# Patient Record
Sex: Male | Born: 1940 | Race: White | Hispanic: No | Marital: Single | Smoking: Current every day smoker
Health system: Southern US, Community
[De-identification: ages and names within clinical notes are randomized; demographics above are authoritative.]

## PROBLEM LIST (undated history)

## (undated) DIAGNOSIS — F329 Major depressive disorder, single episode, unspecified: Secondary | ICD-10-CM

## (undated) DIAGNOSIS — F32A Depression, unspecified: Secondary | ICD-10-CM

## (undated) DIAGNOSIS — N429 Disorder of prostate, unspecified: Secondary | ICD-10-CM

## (undated) DIAGNOSIS — I10 Essential (primary) hypertension: Secondary | ICD-10-CM

## (undated) DIAGNOSIS — F909 Attention-deficit hyperactivity disorder, unspecified type: Secondary | ICD-10-CM

---

## 2017-05-27 ENCOUNTER — Emergency Department (HOSPITAL_COMMUNITY)
Admission: EM | Admit: 2017-05-27 | Discharge: 2017-05-27 | Disposition: A | Discharge status: Home / Self Care | Payer: Self-pay | Attending: Emergency Medicine | Admitting: Emergency Medicine

## 2017-05-27 ENCOUNTER — Emergency Department (HOSPITAL_COMMUNITY): Payer: Self-pay

## 2017-05-27 ENCOUNTER — Other Ambulatory Visit: Payer: Self-pay

## 2017-05-27 ENCOUNTER — Encounter (HOSPITAL_COMMUNITY): Payer: Self-pay | Admitting: *Deleted

## 2017-05-27 DIAGNOSIS — I10 Essential (primary) hypertension: Secondary | ICD-10-CM | POA: Insufficient documentation

## 2017-05-27 DIAGNOSIS — I160 Hypertensive urgency: Secondary | ICD-10-CM | POA: Insufficient documentation

## 2017-05-27 DIAGNOSIS — F17200 Nicotine dependence, unspecified, uncomplicated: Secondary | ICD-10-CM | POA: Insufficient documentation

## 2017-05-27 DIAGNOSIS — Z79899 Other long term (current) drug therapy: Secondary | ICD-10-CM | POA: Insufficient documentation

## 2017-05-27 HISTORY — DX: Disorder of prostate, unspecified: N42.9

## 2017-05-27 HISTORY — DX: Major depressive disorder, single episode, unspecified: F32.9

## 2017-05-27 HISTORY — DX: Depression, unspecified: F32.A

## 2017-05-27 HISTORY — DX: Attention-deficit hyperactivity disorder, unspecified type: F90.9

## 2017-05-27 HISTORY — DX: Essential (primary) hypertension: I10

## 2017-05-27 LAB — CBC
HEMATOCRIT: 37.4 % — AB (ref 39.0–52.0)
HEMOGLOBIN: 12.7 g/dL — AB (ref 13.0–17.0)
MCH: 32.6 pg (ref 26.0–34.0)
MCHC: 34 g/dL (ref 30.0–36.0)
MCV: 96.1 fL (ref 78.0–100.0)
Platelets: 285 10*3/uL (ref 150–400)
RBC: 3.89 MIL/uL — AB (ref 4.22–5.81)
RDW: 12.5 % (ref 11.5–15.5)
WBC: 4.7 10*3/uL (ref 4.0–10.5)

## 2017-05-27 LAB — PROTIME-INR
INR: 0.99
Prothrombin Time: 13 seconds (ref 11.4–15.2)

## 2017-05-27 LAB — DIFFERENTIAL
Basophils Absolute: 0.1 10*3/uL (ref 0.0–0.1)
Basophils Relative: 2 %
EOS PCT: 5 %
Eosinophils Absolute: 0.2 10*3/uL (ref 0.0–0.7)
LYMPHS ABS: 1.5 10*3/uL (ref 0.7–4.0)
LYMPHS PCT: 32 %
MONO ABS: 0.3 10*3/uL (ref 0.1–1.0)
MONOS PCT: 6 %
Neutro Abs: 2.6 10*3/uL (ref 1.7–7.7)
Neutrophils Relative %: 55 %

## 2017-05-27 LAB — COMPREHENSIVE METABOLIC PANEL
ALBUMIN: 4 g/dL (ref 3.5–5.0)
ALK PHOS: 57 U/L (ref 38–126)
ALT: 13 U/L — ABNORMAL LOW (ref 17–63)
ANION GAP: 9 (ref 5–15)
AST: 18 U/L (ref 15–41)
BILIRUBIN TOTAL: 0.9 mg/dL (ref 0.3–1.2)
BUN: 13 mg/dL (ref 6–20)
CALCIUM: 9.5 mg/dL (ref 8.9–10.3)
CO2: 26 mmol/L (ref 22–32)
Chloride: 100 mmol/L — ABNORMAL LOW (ref 101–111)
Creatinine, Ser: 0.87 mg/dL (ref 0.61–1.24)
GLUCOSE: 110 mg/dL — AB (ref 65–99)
Potassium: 4.7 mmol/L (ref 3.5–5.1)
Sodium: 135 mmol/L (ref 135–145)
TOTAL PROTEIN: 6.6 g/dL (ref 6.5–8.1)

## 2017-05-27 LAB — I-STAT CHEM 8, ED
BUN: 14 mg/dL (ref 6–20)
CREATININE: 0.8 mg/dL (ref 0.61–1.24)
Calcium, Ion: 1.19 mmol/L (ref 1.15–1.40)
Chloride: 98 mmol/L — ABNORMAL LOW (ref 101–111)
Glucose, Bld: 105 mg/dL — ABNORMAL HIGH (ref 65–99)
HEMATOCRIT: 37 % — AB (ref 39.0–52.0)
Hemoglobin: 12.6 g/dL — ABNORMAL LOW (ref 13.0–17.0)
Potassium: 4.4 mmol/L (ref 3.5–5.1)
Sodium: 135 mmol/L (ref 135–145)
TCO2: 26 mmol/L (ref 22–32)

## 2017-05-27 LAB — CBG MONITORING, ED: GLUCOSE-CAPILLARY: 94 mg/dL (ref 65–99)

## 2017-05-27 LAB — APTT: aPTT: 29 seconds (ref 24–36)

## 2017-05-27 LAB — I-STAT TROPONIN, ED: Troponin i, poc: 0 ng/mL (ref 0.00–0.08)

## 2017-05-27 MED ORDER — HYDRALAZINE HCL 20 MG/ML IJ SOLN
10.0000 mg | INTRAMUSCULAR | Status: AC
  Administered 2017-05-27: 10 mg via INTRAVENOUS
  Filled 2017-05-27: qty 1

## 2017-05-27 MED ORDER — HYDRALAZINE HCL 10 MG PO TABS: 5.0000 mg | ORAL_TABLET | Freq: Two times a day (BID) | ORAL | 0 refills | Status: DC

## 2017-05-27 NOTE — ED Triage Notes (Signed)
Pt arrived via ems with complaint of high bp, headache intermittent for 2-3 weeks and dizziness intermittently for about the same time.  No headache or dizziness at this time.  Reports ringing in his ears.  Pt is alert.  CBG 94 per ems with BP 190/100.  Pt has no active neuro deficits at this time

## 2017-05-27 NOTE — Discharge Instructions (Signed)
As discussed, today's evaluation has been generally reassuring.  On we have made one adjustment to your medication regimen for blood pressure control. Be sure to see your physician as scheduled on Monday, and to discuss today's adjustments.  Return here for concerning changes in your condition.

## 2017-05-27 NOTE — ED Notes (Signed)
The pt has had an intermittent headache  For 3-4 weeks  No headache now he takes ibu and that usually helps  Dizziness intermittent.  None now dx with vertigo

## 2017-05-27 NOTE — ED Notes (Signed)
The pt has been homeless since last July he just recently found a place to live

## 2017-05-27 NOTE — ED Notes (Signed)
[facility] d/t fall risk

## 2017-05-27 NOTE — ED Provider Notes (Signed)
[facility] Provider Note   CSN: [id number] Arrival date & time: 05/27/17  1022     History   Chief Complaint Chief Complaint  Patient presents with  . Headache  . Dizziness  . Hypertension    HPI Herbert Lewis is a 77 y.o. male.  HPI  Patient presents with concern of hypertension. Patient has a history of hypertension dating back years, states that he takes all medication as directed, 1 provided to him at his halfway house. He notes that for long time he has had episodes of headache, dizziness, and these have been unchanged, and today are actually better than those he experienced yesterday. However, given the patient's history of hypertension, today, after the nurse found him to be hypertensive he was sent here for evaluation per Currently denies any ongoing headache, dizziness, weakness in his extremities. He denies recent medication change, diet change, activity change.   Past Medical History:  Diagnosis Date  . ADHD   . Depression   . Hypertension   . Prostate disease     There are no active problems to display for this patient.   History reviewed. No pertinent surgical history.     Home Medications    Prior to Admission medications   Medication Sig Start Date End Date Taking? Authorizing Provider  albuterol (PROVENTIL HFA;VENTOLIN HFA) 108 (90 Base) MCG/ACT inhaler Inhale 1-2 puffs into the lungs every 6 (six) hours as needed for wheezing or shortness of breath.   Yes [provider]  FLUoxetine (PROZAC) 20 MG capsule Take 40 mg by mouth daily. 09/01/16  Yes [provider]  ibuprofen (ADVIL,MOTRIN) 200 MG tablet Take 800 mg by mouth every 6 (six) hours as needed for headache or moderate pain.   Yes [provider]  lisinopril (PRINIVIL,ZESTRIL) 10 MG tablet Take 1 tablet by mouth daily.   Yes [provider]  Meclizine HCl 25 MG CHEW Chew 1 tablet by mouth daily as needed for dizziness.    Yes [provider]  methylphenidate (RITALIN) 20 MG tablet Take 20 mg by mouth daily. 08/30/16  Yes [provider]  QUEtiapine (SEROQUEL) 100 MG tablet Take 150 mg by mouth at bedtime. 08/30/16  Yes [provider]  tamsulosin (FLOMAX) 0.4 MG CAPS capsule Take 0.4 mg by mouth daily after supper.   Yes [provider]    Family History No family history on file.  Social History Social History   Tobacco Use  . Smoking status: Current Every Day Smoker  . Smokeless tobacco: Current User  Substance Use Topics  . Alcohol use: No    Frequency: Never  . Drug use: No     Allergies   Patient has no known allergies.   Review of Systems Review of Systems  Constitutional:       Per HPI, otherwise negative  HENT:       Per HPI, otherwise negative  Respiratory:       Per HPI, otherwise negative  Cardiovascular:       Per HPI, otherwise negative  Gastrointestinal: Negative for vomiting.  Endocrine:       Negative aside from HPI  Genitourinary:       Neg aside from HPI   Musculoskeletal:       Per HPI, otherwise negative  Skin: Negative.   Neurological: Positive for dizziness and headaches. Negative for syncope.     Physical Exam Updated Vital Signs BP (!) 176/95   Pulse 67  Temp 98.6 F (37 C) (Oral)   Resp 17   SpO2 98%   Physical Exam  Constitutional: He is oriented to person, place, and time. He appears well-developed. No distress.  HENT:  Head: Normocephalic and atraumatic.  Mouth/Throat:    Eyes: Conjunctivae and EOM are normal.  Cardiovascular: Normal rate and regular rhythm.  Pulmonary/Chest: Effort normal. No stridor. No respiratory distress.  Abdominal: He exhibits no distension.  Musculoskeletal: He exhibits no edema.  Neurological: He is alert and oriented to person, place, and time. He displays no tremor. No cranial nerve deficit. He exhibits normal muscle tone. Coordination normal.  Skin: Skin is warm and dry.    Psychiatric: He has a normal mood and affect.  Nursing note and vitals reviewed.    ED Treatments / Results  Labs (all labs ordered are listed, but only abnormal results are displayed) Labs Reviewed  CBC - Abnormal; Notable for the following components:      Result Value   RBC 3.89 (*)    Hemoglobin 12.7 (*)    HCT 37.4 (*)    All other components within normal limits  COMPREHENSIVE METABOLIC PANEL - Abnormal; Notable for the following components:   Chloride 100 (*)    Glucose, Bld 110 (*)    ALT 13 (*)    All other components within normal limits  I-STAT CHEM 8, ED - Abnormal; Notable for the following components:   Chloride 98 (*)    Glucose, Bld 105 (*)    Hemoglobin 12.6 (*)    HCT 37.0 (*)    All other components within normal limits  PROTIME-INR  APTT  DIFFERENTIAL  I-STAT TROPONIN, ED  CBG MONITORING, ED    EKG  EKG Interpretation  Date/Time:  Friday May 27 2017 10:39:00 EDT Ventricular Rate:  73 PR Interval:  172 QRS Duration: 102 QT Interval:  396 QTC Calculation: 436 R Axis:   -26 Text Interpretation:  Normal sinus rhythm Minimal voltage criteria for LVH, may be normal variant Nonspecific T wave abnormality Abnormal ekg Confirmed by Gerhard Munch [phone]) on 05/27/2017 6:39:55 PM       Radiology Ct Head Wo Contrast  Result Date: 05/27/2017 CLINICAL DATA:  Dizziness and hypertension. EXAM: CT HEAD WITHOUT CONTRAST TECHNIQUE: Contiguous axial images were obtained from the base of the skull through the vertex without intravenous contrast. COMPARISON:  None. FINDINGS: Brain: There is mild to moderate diffuse atrophy. There is no intracranial mass, hemorrhage, extra-axial fluid collection, or midline shift. There is slight small vessel disease in the centra semiovale bilaterally. Elsewhere gray-white compartments appear normal. No evident acute infarct. Vascular: No hyperdense vessels evident. There is calcification in each carotid siphon and distal  vertebral artery. Skull: Bony calvarium appears intact. Sinuses/Orbits: There is opacification in multiple ethmoid air cells bilaterally. There is also mucosal thickening in the inferior right frontal sinus. There is opacification with mucosal thickening in the anterior sphenoid sinuses bilaterally. Visualized orbits appear symmetric bilaterally. Other: Mastoid air cells are clear. IMPRESSION: Atrophy with mild periventricular small vessel disease. No acute infarct evident. No mass or hemorrhage. There are foci of arterial vascular calcification. There are foci of paranasal sinus disease. Electronically Signed   By: Bretta Bang III M.D.   On: 05/27/2017 11:12    Procedures Procedures (including critical care time)  Medications Ordered in ED Medications  hydrALAZINE (APRESOLINE) injection 10 mg (10 mg Intravenous Given 05/27/17 2020)     Initial Impression / Assessment and Plan / ED Course  I have reviewed the triage vital signs and the nursing notes.  Pertinent labs & imaging results that were available during my care of the patient were reviewed by me and considered in my medical decision making (see chart for details).     9:57 PM Patient in no distress, awake, alert, smiling, states that he feels better. Blood pressure has diminished. We discussed the importance of following up with primary care. Now with no ongoing symptoms, no evidence for new endorgan effects, no distress, and with his improvement, the patient is appropriate for medication adjustments with his physician. He notes that he has an appointment scheduled in 48 hours.  On he was encouraged to make sure to go to that appointment.   Final Clinical Impressions(s) / ED Diagnoses  Hypertensive urgency   Gerhard Munch, MD 05/27/17 2158

## 2017-06-07 ENCOUNTER — Encounter (HOSPITAL_COMMUNITY): Payer: Self-pay | Admitting: Emergency Medicine

## 2017-06-07 ENCOUNTER — Emergency Department (HOSPITAL_COMMUNITY)
Admission: EM | Admit: 2017-06-07 | Discharge: 2017-06-07 | Disposition: A | Discharge status: Home / Self Care | Payer: Self-pay | Attending: Emergency Medicine | Admitting: Emergency Medicine

## 2017-06-07 DIAGNOSIS — Z79899 Other long term (current) drug therapy: Secondary | ICD-10-CM | POA: Insufficient documentation

## 2017-06-07 DIAGNOSIS — F17210 Nicotine dependence, cigarettes, uncomplicated: Secondary | ICD-10-CM | POA: Insufficient documentation

## 2017-06-07 DIAGNOSIS — R42 Dizziness and giddiness: Secondary | ICD-10-CM | POA: Insufficient documentation

## 2017-06-07 DIAGNOSIS — I10 Essential (primary) hypertension: Secondary | ICD-10-CM | POA: Insufficient documentation

## 2017-06-07 LAB — URINALYSIS, ROUTINE W REFLEX MICROSCOPIC
BILIRUBIN URINE: NEGATIVE
GLUCOSE, UA: NEGATIVE mg/dL
HGB URINE DIPSTICK: NEGATIVE
Ketones, ur: NEGATIVE mg/dL
Leukocytes, UA: NEGATIVE
Nitrite: NEGATIVE
PROTEIN: NEGATIVE mg/dL
Specific Gravity, Urine: 1.005 (ref 1.005–1.030)
pH: 7 (ref 5.0–8.0)

## 2017-06-07 LAB — CBC
HCT: 36.3 % — ABNORMAL LOW (ref 39.0–52.0)
Hemoglobin: 12.6 g/dL — ABNORMAL LOW (ref 13.0–17.0)
MCH: 33.1 pg (ref 26.0–34.0)
MCHC: 34.7 g/dL (ref 30.0–36.0)
MCV: 95.3 fL (ref 78.0–100.0)
PLATELETS: 326 10*3/uL (ref 150–400)
RBC: 3.81 MIL/uL — AB (ref 4.22–5.81)
RDW: 12.3 % (ref 11.5–15.5)
WBC: 5.7 10*3/uL (ref 4.0–10.5)

## 2017-06-07 LAB — BASIC METABOLIC PANEL
Anion gap: 11 (ref 5–15)
BUN: 11 mg/dL (ref 6–20)
CALCIUM: 9.1 mg/dL (ref 8.9–10.3)
CHLORIDE: 97 mmol/L — AB (ref 101–111)
CO2: 26 mmol/L (ref 22–32)
CREATININE: 0.94 mg/dL (ref 0.61–1.24)
GFR calc Af Amer: >60 mL/min (ref >60)
GFR calc non Af Amer: >60 mL/min (ref >60)
GLUCOSE: 106 mg/dL — AB (ref 65–99)
Potassium: 4.1 mmol/L (ref 3.5–5.1)
Sodium: 134 mmol/L — ABNORMAL LOW (ref 135–145)

## 2017-06-07 LAB — I-STAT TROPONIN, ED: Troponin i, poc: 0 ng/mL (ref 0.00–0.08)

## 2017-06-07 MED ORDER — HYDRALAZINE HCL 20 MG/ML IJ SOLN
10.0000 mg | Freq: Once | INTRAMUSCULAR | Status: AC
  Administered 2017-06-07: 10 mg via INTRAVENOUS
  Filled 2017-06-07: qty 1

## 2017-06-07 MED ORDER — AMLODIPINE BESYLATE 10 MG PO TABS: 10.0000 mg | ORAL_TABLET | Freq: Every day | ORAL | 0 refills | Status: AC

## 2017-06-07 MED ORDER — LISINOPRIL 10 MG PO TABS
10.0000 mg | ORAL_TABLET | Freq: Once | ORAL | Status: AC
  Administered 2017-06-07: 10 mg via ORAL
  Filled 2017-06-07: qty 1

## 2017-06-07 NOTE — Discharge Instructions (Signed)
Start amlodipine. Take both amlodipine and lisinopril daily, as prescribed. Throw away your prescription for hydralazine, and do not take this medication.   Avoid tobacco, caffeine, energy drinks. These increase blood pressure.   Sit down and wait 5 minute before checking your blood pressure, make sure your arm is fully extended and at heart level. Goal blood pressure is around 150/90s, some variation is ok.    Return for severe headache, vision changes, vomiting, slurred speech, facial drooping, weakness or heaviness to extremities, chest pain, tearing back pain.

## 2017-06-07 NOTE — ED Notes (Signed)
Meal and drink given to pt - per Debarah Crapelaudia EDPA

## 2017-06-07 NOTE — ED Triage Notes (Signed)
Pt arrives via EMS from home with complaints of HTN, HA and dizziness for 2-3 weeks. Pt was seen here 2 weeks ago and given a prescription, however, he reports his PCP increased his lisinopril instead of changing medications. 20G LAC

## 2017-06-07 NOTE — ED Notes (Signed)
Pt reports intermittent lightheadedness x 2-3 weeks with HTN.  Have been seen in the ED for same, [facility] and increased his lisinopril without relief.  Pt denies any lightheadedness at present but started to have a h/a while waiting to be seen in the ED.  Pt is A&Ox 4.  Negative for cp or SOB.  Pt reports hx of vertigo.

## 2017-06-07 NOTE — ED Provider Notes (Signed)
[facility] Provider Note   CSN: [id number] Arrival date & time: 06/07/17  1230     History   Chief Complaint Chief Complaint  Patient presents with  . Hypertension  . Dizziness    HPI Herbert Lewis is a 77 y.o. male w/ h/o HTN, depression, here for concern of elevated BP reading this morning up to 200/109 and 109/114. Reports BP has been persistently elevated since end of 2018, [facility]ril 10 mg BID to TID however not helping. Last took lisinopril this AM. Reports ongoing headaches and light-headedness since end of 2018 that he attributes to elevated BP. Light-headedness described as "feels like I need to lay down", no CP, SOB, palpitations with it. Has h/o vertigo that usually occurs when he lays down. Had slight headache while in waiting room but not currently. Asymptomatic now. Has f/u with PCP at [facility] in 2 days. Seen in ED for same 10 days ago.   HPI  Past Medical History:  Diagnosis Date  . ADHD   . Depression   . Hypertension   . Prostate disease     There are no active problems to display for this patient.   History reviewed. No pertinent surgical history.      Home Medications    Prior to Admission medications   Medication Sig Start Date End Date Taking? Authorizing Provider  albuterol (PROVENTIL HFA;VENTOLIN HFA) 108 (90 Base) MCG/ACT inhaler Inhale 1-2 puffs into the lungs every 6 (six) hours as needed for wheezing or shortness of breath.   Yes [provider]  FLUoxetine (PROZAC) 40 MG capsule Take 80 mg by mouth daily.  09/01/16  Yes [provider]  ibuprofen (ADVIL,MOTRIN) 200 MG tablet Take 800 mg by mouth every 6 (six) hours as needed for headache or moderate pain.   Yes [provider]  lisinopril (PRINIVIL,ZESTRIL) 10 MG tablet Take 10 mg by mouth 3 (three) times daily.    Yes [provider]  Meclizine HCl 25 MG CHEW Chew 1 tablet by mouth daily as needed  for dizziness.   Yes [provider]  methylphenidate (RITALIN) 20 MG tablet Take 20 mg by mouth daily. 08/30/16  Yes [provider]  oxybutynin (DITROPAN) 5 MG tablet Take 5 mg by mouth 3 (three) times daily.   Yes [provider]  QUEtiapine (SEROQUEL) 100 MG tablet Take 150 mg by mouth at bedtime. 08/30/16  Yes [provider]  tamsulosin (FLOMAX) 0.4 MG CAPS capsule Take 0.4 mg by mouth daily after supper.   Yes [provider]  amLODipine (NORVASC) 10 MG tablet Take 1 tablet (10 mg total) by mouth daily for 15 days. 06/07/17 06/22/17  Liberty HandyGibbons, Megha Agnes J, PA-C    Family History No family history on file.  Social History Social History   Tobacco Use  . Smoking status: Current Every Day Smoker  . Smokeless tobacco: Current User  Substance Use Topics  . Alcohol use: No    Frequency: Never  . Drug use: No     Allergies   Codeine   Review of Systems Review of Systems  Neurological: Positive for light-headedness (not currently) and headaches (not currently; several months).     Physical Exam Updated Vital Signs BP (!) 147/85 (BP Location: Left Arm)   Pulse 67   Resp 17   SpO2 97%   Physical Exam  Constitutional: He appears well-developed and well-nourished.  NAD. Non toxic.   HENT:  Head:  Normocephalic and atraumatic.  Nose: Nose normal.  Moist mucous membranes. Tonsils and oropharynx normal  Eyes: Conjunctivae, EOM and lids are normal.  Neck: Trachea normal and normal range of motion.  Neck is supple Trachea midline No cervical adenopathy  Cardiovascular: Normal rate, regular rhythm, S1 normal, S2 normal and normal heart sounds.  Pulses:      Carotid pulses are 2+ on the right side, and 2+ on the left side.      Radial pulses are 2+ on the right side, and 2+ on the left side.       Dorsalis pedis pulses are 2+ on the right side, and 2+ on the left side.  RRR. No orthopnea. No LE edema or calf tenderness.     Pulmonary/Chest: Effort normal and breath sounds normal. No respiratory distress. He has no decreased breath sounds. He has no rhonchi.  No reproducible chest wall tenderness. CP not reproducible with AROM of upper extremities. No rales or wheezing.  Abdominal: Soft. Bowel sounds are normal. There is no tenderness.  No epigastric tenderness. No distention.   Neurological: He is alert. GCS eye subscore is 4. GCS verbal subscore is 5. GCS motor subscore is 6.  Skin: Skin is warm and dry. Capillary refill takes less than 2 seconds.  No rash to chest wall  Psychiatric: He has a normal mood and affect. His speech is normal and behavior is normal. Judgment and thought content normal. Cognition and memory are normal.     ED Treatments / Results  Labs (all labs ordered are listed, but only abnormal results are displayed) Labs Reviewed  BASIC METABOLIC PANEL - Abnormal; Notable for the following components:      Result Value   Sodium 134 (*)    Chloride 97 (*)    Glucose, Bld 106 (*)    All other components within normal limits  CBC - Abnormal; Notable for the following components:   RBC 3.81 (*)    Hemoglobin 12.6 (*)    HCT 36.3 (*)    All other components within normal limits  URINALYSIS, ROUTINE W REFLEX MICROSCOPIC - Abnormal; Notable for the following components:   Color, Urine STRAW (*)    All other components within normal limits  I-STAT TROPONIN, ED  CBG MONITORING, ED    EKG EKG Interpretation  Date/Time:  Tuesday June 07 2017 12:42:27 EDT Ventricular Rate:  65 PR Interval:  158 QRS Duration: 94 QT Interval:  408 QTC Calculation: 424 R Axis:   -15 Text Interpretation:  Normal sinus rhythm Left ventricular hypertrophy with repolarization abnormality Abnormal ECG new T wave inversions inferiorly and V5-V6 Confirmed by Frederick Peers [phone]) on 06/07/2017 7:53:23 PM   Radiology No results found.  Procedures Procedures (including critical care time)  Medications  Ordered in ED Medications  hydrALAZINE (APRESOLINE) injection 10 mg (10 mg Intravenous Given 06/07/17 2115)  lisinopril (PRINIVIL,ZESTRIL) tablet 10 mg (10 mg Oral Given 06/07/17 2114)     Initial Impression / Assessment and Plan / ED Course  I have reviewed the triage vital signs and the nursing notes.  Pertinent labs & imaging results that were available during my care of the patient were reviewed by me and considered in my medical decision making (see chart for details).    77 year old male with hypertension here for concern of elevated blood pressure reading and intermittent, mild headache and lightheadedness, chronic. Seen in the ED 10 days ago for same.   He is asymptomatic in the emergency department. Initial  BP elevated.  Pt did not take his afternoon lisinopril dose. He denies any current headache, dizziness, vision changes or nausea, vomiting, chest pain, shortness of breath, focal neuro deficits. No crackles on exam or signs of fluid overload. Creatinine normal. EKG within normal limits. Negative troponin. Patient had a normal CT head 10 days ago when he was seen for the same in the ED, as he is HA free with normal neuro exam I don't think that a repeat CT head is indicated today. No Cp, SOB, signs of fluid overload and CXR likely also low yield.   Final Clinical Impressions(s) / ED Diagnoses   Patient given antihypertensive medications in the ED with adequate response, last blood pressure 147/85. At this time patient considered safe for discharge. He has follow-up at the [place] in 2 days. Will discharge with amlodipine 10 mg daily, plus his home lisinopril. Provided patient education on how to measure her blood pressure accurately at home. Discussed return precautions. Patient discussed with Dr. Clarene Duke. Final diagnoses:  Elevated blood pressure reading in office with diagnosis of hypertension    ED Discharge Orders        Ordered    amLODipine (NORVASC) 10 MG tablet  Daily      06/07/17 2239       Liberty Handy, PA-C 06/08/17 0118    Little, Ambrose Finland, MD 06/09/17 2140

## 2017-06-21 ENCOUNTER — Emergency Department (HOSPITAL_COMMUNITY): Payer: Self-pay

## 2017-06-21 ENCOUNTER — Emergency Department (HOSPITAL_COMMUNITY)
Admission: EM | Admit: 2017-06-21 | Discharge: 2017-06-22 | Disposition: A | Discharge status: Home / Self Care | Payer: Self-pay | Attending: Emergency Medicine | Admitting: Emergency Medicine

## 2017-06-21 ENCOUNTER — Encounter (HOSPITAL_COMMUNITY): Payer: Self-pay

## 2017-06-21 ENCOUNTER — Other Ambulatory Visit: Payer: Self-pay

## 2017-06-21 DIAGNOSIS — R0789 Other chest pain: Secondary | ICD-10-CM | POA: Insufficient documentation

## 2017-06-21 DIAGNOSIS — Z8546 Personal history of malignant neoplasm of prostate: Secondary | ICD-10-CM | POA: Insufficient documentation

## 2017-06-21 DIAGNOSIS — Z79899 Other long term (current) drug therapy: Secondary | ICD-10-CM | POA: Insufficient documentation

## 2017-06-21 DIAGNOSIS — R296 Repeated falls: Secondary | ICD-10-CM | POA: Insufficient documentation

## 2017-06-21 DIAGNOSIS — R42 Dizziness and giddiness: Secondary | ICD-10-CM | POA: Insufficient documentation

## 2017-06-21 DIAGNOSIS — F17210 Nicotine dependence, cigarettes, uncomplicated: Secondary | ICD-10-CM | POA: Insufficient documentation

## 2017-06-21 DIAGNOSIS — R079 Chest pain, unspecified: Secondary | ICD-10-CM

## 2017-06-21 DIAGNOSIS — H532 Diplopia: Secondary | ICD-10-CM | POA: Insufficient documentation

## 2017-06-21 LAB — URINALYSIS, ROUTINE W REFLEX MICROSCOPIC
Bilirubin Urine: NEGATIVE
Glucose, UA: NEGATIVE mg/dL
Hgb urine dipstick: NEGATIVE
Ketones, ur: NEGATIVE mg/dL
Leukocytes, UA: NEGATIVE
Nitrite: NEGATIVE
Protein, ur: NEGATIVE mg/dL
Specific Gravity, Urine: 1.011 (ref 1.005–1.030)
pH: 6 (ref 5.0–8.0)

## 2017-06-21 LAB — BASIC METABOLIC PANEL WITH GFR
Anion gap: 11 (ref 5–15)
BUN: 17 mg/dL (ref 6–20)
CO2: 26 mmol/L (ref 22–32)
Calcium: 9.5 mg/dL (ref 8.9–10.3)
Chloride: 98 mmol/L — ABNORMAL LOW (ref 101–111)
Creatinine, Ser: 0.83 mg/dL (ref 0.61–1.24)
GFR calc Af Amer: >60 mL/min
GFR calc non Af Amer: >60 mL/min
Glucose, Bld: 100 mg/dL — ABNORMAL HIGH (ref 65–99)
Potassium: 3.8 mmol/L (ref 3.5–5.1)
Sodium: 135 mmol/L (ref 135–145)

## 2017-06-21 LAB — CBC
HEMATOCRIT: 36.4 % — AB (ref 39.0–52.0)
Hemoglobin: 12.6 g/dL — ABNORMAL LOW (ref 13.0–17.0)
MCH: 32.7 pg (ref 26.0–34.0)
MCHC: 34.6 g/dL (ref 30.0–36.0)
MCV: 94.5 fL (ref 78.0–100.0)
PLATELETS: 322 10*3/uL (ref 150–400)
RBC: 3.85 MIL/uL — ABNORMAL LOW (ref 4.22–5.81)
RDW: 12.1 % (ref 11.5–15.5)
WBC: 8.9 10*3/uL (ref 4.0–10.5)

## 2017-06-21 MED ORDER — HYDROCODONE-ACETAMINOPHEN 5-325 MG PO TABS
1.0000 | ORAL_TABLET | Freq: Once | ORAL | Status: AC
  Administered 2017-06-21: 1 via ORAL
  Filled 2017-06-21: qty 1

## 2017-06-21 MED ORDER — GADOBENATE DIMEGLUMINE 529 MG/ML IV SOLN
20.0000 mL | Freq: Once | INTRAVENOUS | Status: AC | PRN
  Administered 2017-06-21: 19 mL via INTRAVENOUS

## 2017-06-21 MED ORDER — IOHEXOL 300 MG/ML  SOLN
75.0000 mL | Freq: Once | INTRAMUSCULAR | Status: AC | PRN
  Administered 2017-06-21: 75 mL via INTRAVENOUS

## 2017-06-21 NOTE — ED Triage Notes (Signed)
Pt is c/o of rt side middle back pain  10/10 in which he reports that onset occurred [place] reports that pain is worse with movement and deep breaths . Pt denies LOC and any other area of pain at this time.

## 2017-06-21 NOTE — ED Triage Notes (Signed)
Per EMS-[place] about [place]-[facility]-complaining for right flank pain-pain when he raises his right arm and with inhalation

## 2017-06-22 MED ORDER — NAPROXEN 250 MG PO TABS: 250.0000 mg | ORAL_TABLET | Freq: Two times a day (BID) | ORAL | 0 refills | Status: AC

## 2017-06-22 MED ORDER — ACETAMINOPHEN 325 MG PO TABS: 650.0000 mg | ORAL_TABLET | Freq: Four times a day (QID) | ORAL | 0 refills | Status: AC | PRN

## 2017-06-22 NOTE — Discharge Instructions (Addendum)
Continue taking your at home medications as prescribed. Take naproxen 2 times a day with meals.  Do not take other anti-inflammatories at the same time open (Advil, Motrin, ibuprofen, Aleve). You may supplement with Tylenol if you need further pain control. Use ice packs or heating pads if this helps control your pain. It is important that you take deep breaths several times a day to help prevent infection. Follow-up with your primary care doctor at your scheduled appointment on Thursday. Return to the emergency room if you develop fevers, worsening cough, difficulty breathing, or any new or concerning symptoms.

## 2017-06-22 NOTE — ED Provider Notes (Signed)
[facility]-EMERGENCY DEPT Provider Note   CSN: [id number] Arrival date & time: 06/21/17  1504     History   Chief Complaint Chief Complaint  Patient presents with  . Fall  . Back Pain    HPI Herbert Lewis is a 77 y.o. male presenting for evaluation of right-sided pain after a fall.  Patient states he has had intermittent vertigo over the past 4 years.  He can usually manage this with meclizine, however he has been falling more often over the past several days.  He fell once yesterday and once today.  He landed on his back today.  Denies hitting his head or loss of consciousness.  He reports pain of the right middle back and chest.  It is worse with inspiration.  He denies pain elsewhere.  He has ambulated since the fall without difficulty.  He denies numbness or tingling.  He is not on blood thinners.  He has not taken anything for pain.  No pain at rest.  Additionally, patient states he has been seeing double vision over the past several days.  This is intermittent, no diplopia currently.  He denies headache, slurred speech, or weakness.  He denies history of similar.  He has a history of stage 4 prostate cancer with metastases.  HPI  Past Medical History:  Diagnosis Date  . ADHD   . Depression   . Hypertension   . Prostate disease     There are no active problems to display for this patient.   History reviewed. No pertinent surgical history.      Home Medications    Prior to Admission medications   Medication Sig Start Date End Date Taking? Authorizing Provider  albuterol (PROVENTIL HFA;VENTOLIN HFA) 108 (90 Base) MCG/ACT inhaler Inhale 1-2 puffs into the lungs every 6 (six) hours as needed for wheezing or shortness of breath.   Yes [provider]  amLODipine (NORVASC) 10 MG tablet Take 1 tablet (10 mg total) by mouth daily for 15 days. 06/07/17 06/22/17 Yes Liberty Handy, PA-C  FLUoxetine (PROZAC) 40 MG capsule Take 80 mg by mouth  daily.  09/01/16  Yes [provider]  ibuprofen (ADVIL,MOTRIN) 200 MG tablet Take 800 mg by mouth every 6 (six) hours as needed for headache or moderate pain.   Yes [provider]  lisinopril (PRINIVIL,ZESTRIL) 10 MG tablet Take 10 mg by mouth 3 (three) times daily.    Yes [provider]  methylphenidate (RITALIN) 20 MG tablet Take 20 mg by mouth daily. 08/30/16  Yes [provider]  oxybutynin (DITROPAN) 5 MG tablet Take 5 mg by mouth 3 (three) times daily.   Yes [provider]  QUEtiapine (SEROQUEL) 100 MG tablet Take 150 mg by mouth at bedtime. 08/30/16  Yes [provider]  tamsulosin (FLOMAX) 0.4 MG CAPS capsule Take 0.4 mg by mouth daily after supper.   Yes [provider]  acetaminophen (TYLENOL) 325 MG tablet Take 2 tablets (650 mg total) by mouth every 6 (six) hours as needed. 06/22/17   Jasdeep Kepner, PA-C  Meclizine HCl 25 MG CHEW Chew 1 tablet by mouth daily as needed for dizziness.    [provider]  naproxen (NAPROSYN) 250 MG tablet Take 1 tablet (250 mg total) by mouth 2 (two) times daily with a meal. 06/22/17   Jaye Polidori, PA-C    Family History History reviewed. No pertinent family history.  Social History Social History   Tobacco Use  . Smoking  status: Current Every Day Smoker  . Smokeless tobacco: Current User  Substance Use Topics  . Alcohol use: No    Frequency: Never  . Drug use: No     Allergies   Codeine   Review of Systems Review of Systems  Eyes: Positive for visual disturbance. Negative for photophobia.  Musculoskeletal: Positive for back pain.  Skin: Negative for wound.  Neurological: Negative for headaches.  All other systems reviewed and are negative.    Physical Exam Updated Vital Signs BP (!) 149/107 (BP Location: Left Arm)   Pulse 68   Temp 98.9 F (37.2 C) (Oral)   Resp 16   Ht 5\' 9"  (1.753 m)   Wt 88.9 kg (196 lb)   SpO2 99%   BMI 28.94 kg/m    Physical Exam  Constitutional: He is oriented to person, place, and time. He appears well-developed and well-nourished. No distress.  HENT:  Head: Normocephalic and atraumatic.  Right Ear: Tympanic membrane, external ear and ear canal normal.  Left Ear: Tympanic membrane, external ear and ear canal normal.  Nose: Nose normal.  Mouth/Throat: Uvula is midline, oropharynx is clear and moist and mucous membranes are normal.  Eyes: Pupils are equal, round, and reactive to light. Conjunctivae and EOM are normal.  L lateral nystagmus. EOMI and PERRLA  Neck: Normal range of motion. Neck supple.  No tenderness to palpation of midline c-spine. No step offs  Cardiovascular: Normal rate, regular rhythm and intact distal pulses.  Pulmonary/Chest: Effort normal and breath sounds normal. No respiratory distress. He has no wheezes. He exhibits tenderness.  TTP of anterior R ribs without flail chest or deformity. Clear lung sounds in all fields.  Abdominal: Soft. He exhibits no distension and no mass. There is no tenderness. There is no guarding.  Musculoskeletal: Normal range of motion. He exhibits no edema or tenderness.  Strength intact x4.  Sensation intact x4.  Color and warmth equal bilaterally.  Ambulatory without difficulty. No tenderness palpation of the back or midline spine.  Neurological: He is alert and oriented to person, place, and time. He has normal strength. No cranial nerve deficit or sensory deficit. He displays a negative Romberg sign. GCS eye subscore is 4. GCS verbal subscore is 5. GCS motor subscore is 6.  Nose to finger intact.  No obvious neurologic deficits.  Skin: Skin is warm and dry.  Psychiatric: He has a normal mood and affect.  Nursing note and vitals reviewed.    ED Treatments / Results  Labs (all labs ordered are listed, but only abnormal results are displayed) Labs Reviewed  CBC - Abnormal; Notable for the following components:      Result Value   RBC 3.85 (*)     Hemoglobin 12.6 (*)    HCT 36.4 (*)    All other components within normal limits  BASIC METABOLIC PANEL - Abnormal; Notable for the following components:   Chloride 98 (*)    Glucose, Bld 100 (*)    All other components within normal limits  URINALYSIS, ROUTINE W REFLEX MICROSCOPIC    EKG None  Radiology Dg Ribs Unilateral W/chest Right  Result Date: 06/21/2017 CLINICAL DATA:  Anterior rib pain EXAM: RIGHT RIBS AND CHEST - 3+ VIEW COMPARISON:  None. FINDINGS: Single-view chest demonstrates no acute opacity or pleural effusion. No pneumothorax. Normal heart size. Aortic atherosclerosis. Old left third, fourth, fifth, and sixth rib fractures. Mild sclerosis at the right fourth, fifth, and sixth ribs consistent with healing/subacute rib fracture. IMPRESSION:  1. Negative for pneumothorax or pleural effusion 2. Healing/subacute right fourth fifth and sixth rib fractures 3. Old bilateral rib fractures. Electronically Signed   By: Jasmine Pang M.D.   On: 06/21/2017 20:46   Dg Lumbar Spine Complete  Result Date: 06/21/2017 CLINICAL DATA:  Fall EXAM: LUMBAR SPINE - COMPLETE 4+ VIEW COMPARISON:  None. FINDINGS: Trace retrolisthesis of L3 on L4 and L4 on L5. Vertebral body heights are normal. Moderate degenerative changes at L1-L2, L3-L4, L4-L5 and mild degenerative changes at L5-S1. Posterior facet degenerative change of the lower lumbar spine. SI joints are patent IMPRESSION: Moderate degenerative changes at multiple levels. No acute osseous abnormality. Electronically Signed   By: Jasmine Pang M.D.   On: 06/21/2017 18:06   Ct Chest W Contrast  Result Date: 06/22/2017 CLINICAL DATA:  Right-sided chest wall/back pain. Chest trauma, rib fracture suspected[place] EXAM: CT CHEST WITH CONTRAST TECHNIQUE: Multidetector CT imaging of the chest was performed during intravenous contrast administration. CONTRAST:  75mL OMNIPAQUE IOHEXOL 300 MG/ML  SOLN COMPARISON:  Chest and right rib  radiographs 06/21/2017 FINDINGS: Cardiovascular: Aortic atherosclerosis and tortuosity. No acute aortic injury. Aneurysmal dilatation of the ascending aorta measuring 4.3 cm. Normal heart size. There are coronary artery calcifications. No pericardial effusion. Mediastinum/Nodes: No mediastinal hemorrhage or hematoma. No pneumomediastinum. No adenopathy. The esophagus is decompressed. No thyroid nodule. Lungs/Pleura: No pneumothorax. No consolidation or pulmonary contusion. No focal airspace disease. Trachea and mainstem bronchi are patent. No pulmonary mass or suspicious nodule. No pleural fluid. Upper Abdomen: No acute abnormality or traumatic injury. 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 Musculoskeletal: Fractures of anterior right fourth, fifth, and sixth ribs have surrounding callus formation and are subacute or chronic. Remote fracture of right lateral tenth rib fracture. Remote fracture of anterior left fourth and fifth ribs. No acute rib fracture. Sternum, included clavicles and shoulder girdles are intact. Degenerative change throughout the thoracic spine without acute fracture. Thoracic vertebral body heights are preserved. No confluent chest wall contusion. IMPRESSION: 1. Fractures of right anterior fourth, fifth, and sixth ribs have surrounding incomplete callus formation and are consistent with subacute fractures. No acute rib fracture. No evidence of acute intrathoracic injury. 2. Remote bilateral rib fractures. 3. Incidental aortic atherosclerosis and coronary artery calcifications. Fusiform aneurysmal dilatation of the ascending aorta maximal dimension 4.3 cm. Recommend annual imaging followup by CTA or MRA. This recommendation follows 2010 ACCF/AHA/AATS/ACR/ASA/SCA/SCAI/SIR/STS/SVM Guidelines for the Diagnosis and Management of Patients with Thoracic Aortic Disease. Circulation. 2010; 121: W098-J191 Aortic Atherosclerosis  (ICD10-I70.0). Electronically Signed   By: Rubye Oaks M.D.   On: 06/22/2017 00:19   Mr Laqueta Jean And Wo Contrast  Result Date: 06/21/2017 CLINICAL DATA:  Diplopia.  Fall. EXAM: MRI HEAD WITHOUT AND WITH CONTRAST TECHNIQUE: Multiplanar, multiecho pulse sequences of the brain and surrounding structures were obtained without and with intravenous contrast. CONTRAST:  19mL MULTIHANCE GADOBENATE DIMEGLUMINE 529 MG/ML IV SOLN COMPARISON:  Head CT 05/27/2017 FINDINGS: BRAIN: The midline structures are normal. There is no acute infarct or acute hemorrhage. No mass lesion, hydrocephalus, dural abnormality or extra-axial collection. Early confluent hyperintense T2-weighted signal of the periventricular and deep white matter, most commonly due to chronic ischemic microangiopathy. Generalized atrophy without lobar predilection. No chronic microhemorrhage or superficial siderosis. VASCULAR: Major intracranial arterial and venous sinus flow voids are preserved. SKULL AND UPPER CERVICAL SPINE: The visualized skull base, calvarium, upper cervical spine and extracranial soft  tissues are normal. SINUSES/ORBITS: No fluid levels or advanced mucosal thickening. No mastoid or middle ear effusion. Normal orbits. IMPRESSION: 1. No acute intracranial abnormality. 2. 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 Electronically Signed   By: Deatra Robinson M.D.   On: 06/21/2017 21:38    Procedures Procedures (including critical care time)  Medications Ordered in ED Medications  HYDROcodone-acetaminophen (NORCO/VICODIN) 5-325 MG per tablet 1 tablet (1 tablet Oral Given 06/21/17 2200)  gadobenate dimeglumine (MULTIHANCE) injection 20 mL (19 mLs Intravenous Contrast Given 06/21/17 2107)  iohexol (OMNIPAQUE) 300 MG/ML solution 75 mL (75 mLs Intravenous Contrast Given 06/21/17 2319)     Initial Impression / Assessment and Plan / ED Course  I have reviewed the triage vital signs and the nursing notes.  Pertinent labs & imaging  results that were available during my care of the patient were reviewed by me and considered in my medical decision making (see chart for details).     Pt presenting for evaluation of R sided pain s/p fall and diplopia.  Physical exam reassuring, no obvious neurologic deficits.  Tenderness palpation of right anterior chest wall without obvious bony injury.  Will give Norco for pain, obtain rib x-ray and brain MRI for further evaluation.  Lumbar spine without fracture or concerning abnormality.  Case discussed with attending, Dr. Rush Landmark evaluated the patient.  Labs are concerning.  UA without sign of infection.  Chest x-ray viewed and interpreted by me, shows subacute fractures.  No other obvious findings.  MRI without bleed, stroke, or mass.  Discussed findings with patient.  Will obtain CT chest due to patient's significant pain on exam to rule out fx or lung injury.  On reassessment, patient reports pain is much improved.  Ct chest shows remote rib fractures with healing, no acute fractures.  No lung injury.  No other concerning findings.  Discussed findings with patient.  Will treat for musculoskeletal pain with Tylenol and ibuprofen.  Follow-up with PCP.  At this time, patient present for discharge.  Return precautions given.  Patient states he understands and agrees to plan.  Final Clinical Impressions(s) / ED Diagnoses   Final diagnoses:  Right-sided chest pain    ED Discharge Orders        Ordered    acetaminophen (TYLENOL) 325 MG tablet  Every 6 hours PRN     06/22/17 0035    naproxen (NAPROSYN) 250 MG tablet  2 times daily with meals     06/22/17 0035       Alveria Apley, PA-C 06/22/17 0038    Tegeler, Canary Brim, MD 06/22/17 [phone]

## 2018-10-30 IMAGING — MR MR HEAD WO/W CM
10 of 13 series · 34 of 48 positions shown · IV contrast (multihance)
Comparison: Head CT 05/27/2017

CLINICAL DATA: Diplopia.  Fall.

EXAM:
MRI HEAD WITHOUT AND WITH CONTRAST
TECHNIQUE: Multiplanar, multiecho pulse sequences of the brain and surrounding
structures were obtained without and with intravenous contrast.
CONTRAST:  19mL MULTIHANCE GADOBENATE DIMEGLUMINE 529 MG/ML IV SOLN

[Series 3: DWI · axial · 3.0mm · 1.09mm/px · z∈[-61,+99]mm · 8 of 112 slices shown (1 of 4)]
[im 1/112]
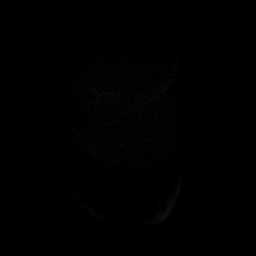
[im 13/112]
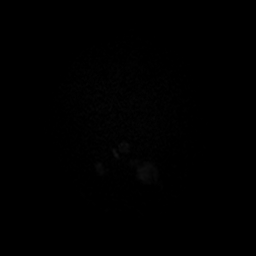
[im 38/112]
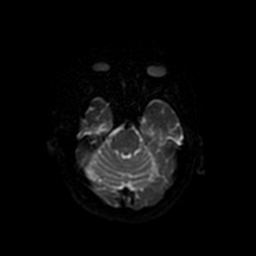
[im 50/112]
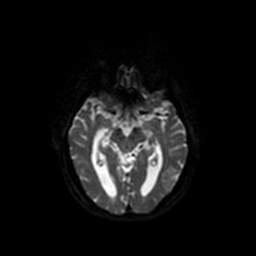
[im 62/112]
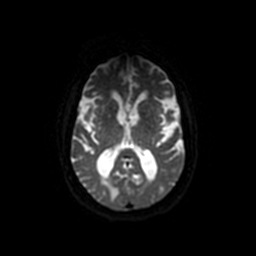
[im 75/112]
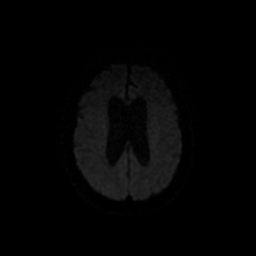
[im 99/112]
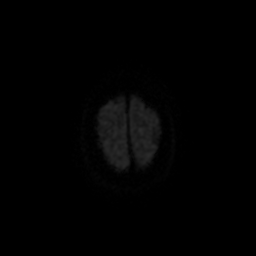
[im 112/112]
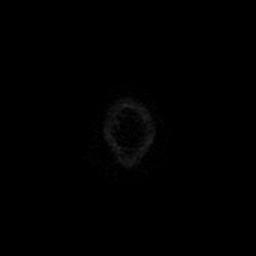

[Series 4: T1 · sagittal · 5.0mm · 0.47mm/px · 2 of 24 slices shown]
[im 1/24]
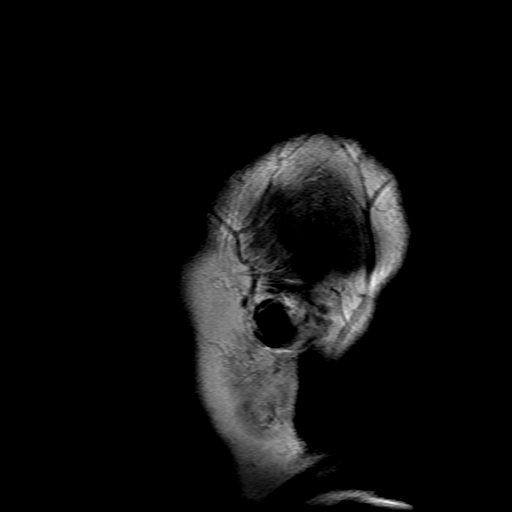
[im 24/24]
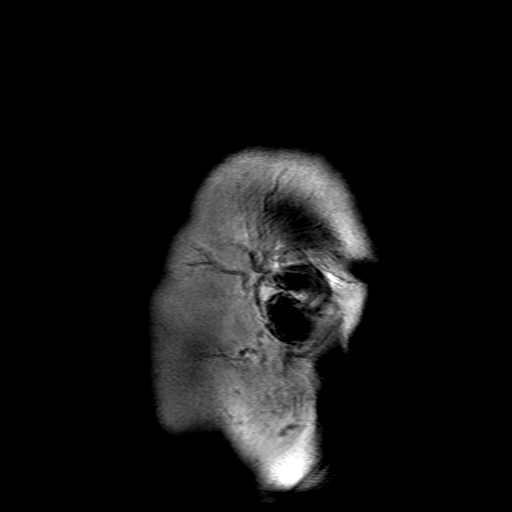

[Series 5: DWI · coronal · 5.0mm · 1.09mm/px · 6 of 72 slices shown (2 of 4)]
[im 1/72]
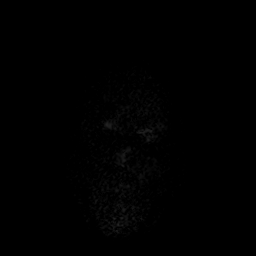
[im 15/72]
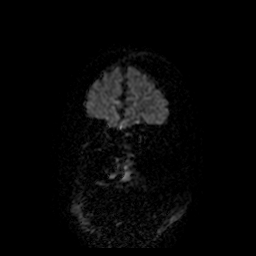
[im 29/72]
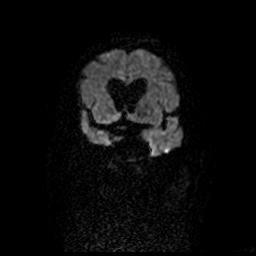
[im 43/72]
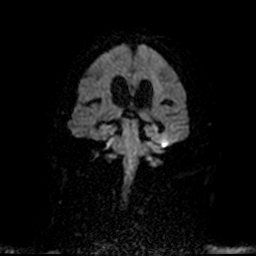
[im 57/72]
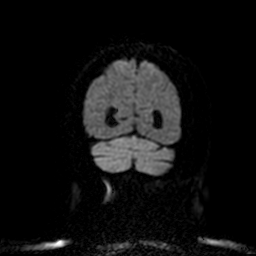
[im 72/72]
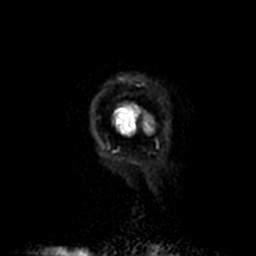

[Series 6: T2 · axial · 5.0mm · 0.43mm/px · z∈[-47,+117]mm · 2 of 29 slices shown]
[im 1/29]
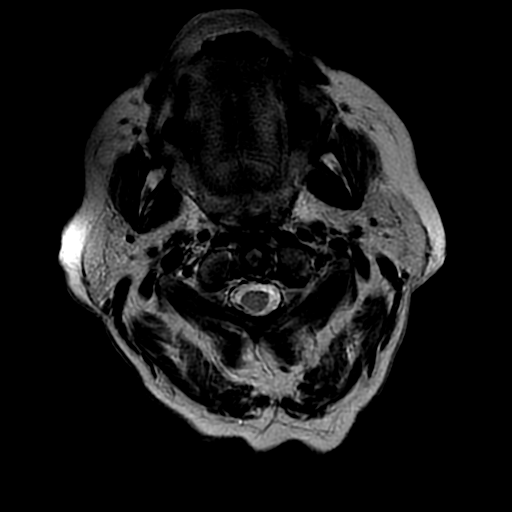
[im 29/29]
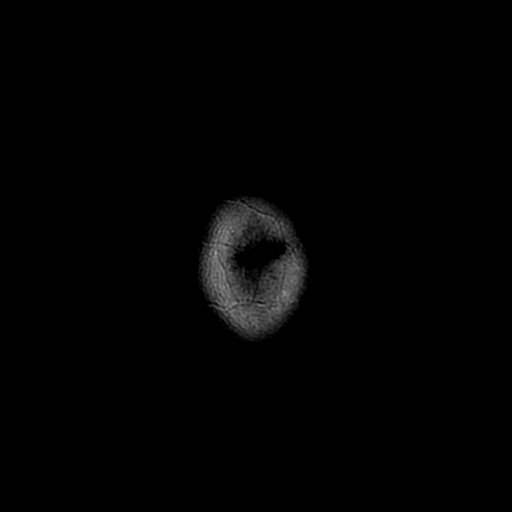

[Series 7: FLAIR · axial · 3.0mm · 0.43mm/px · z∈[-47,+117]mm · 2 of 29 slices shown]
[im 1/29]
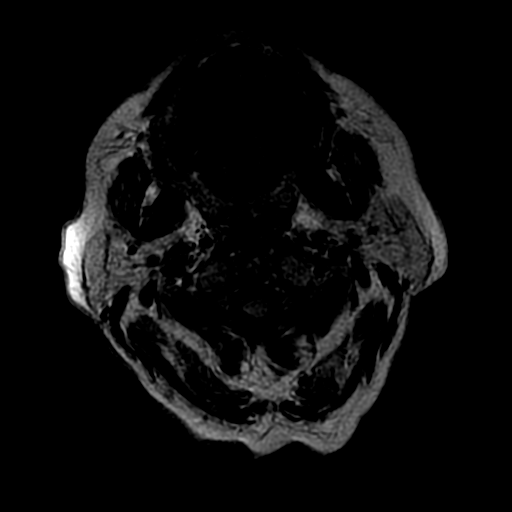
[im 29/29]
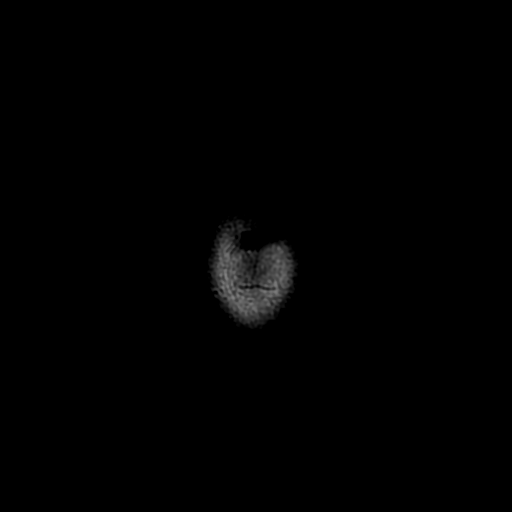

[Series 10: T2 post-contrast · coronal · 5.0mm · 0.45mm/px · 2 of 29 slices shown]
[im 1/29]
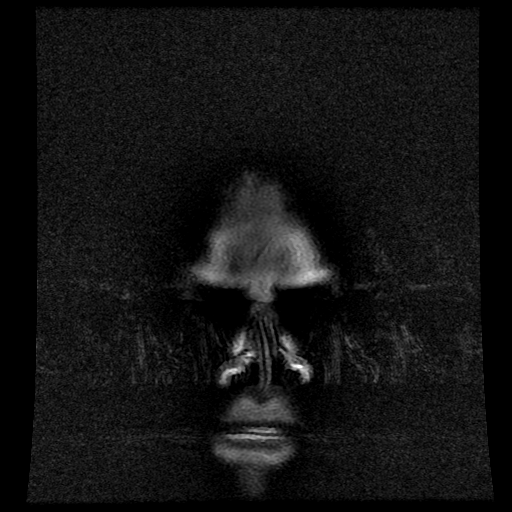
[im 29/29]
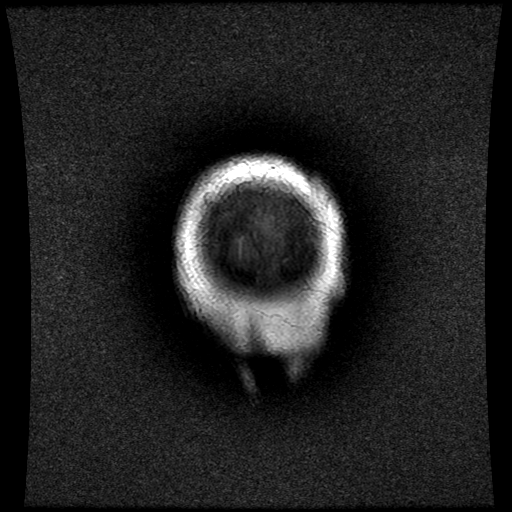

[Series 12: T1 post-contrast · coronal · 5.0mm · 0.45mm/px · 2 of 29 slices shown (1 of 2)]
[im 1/29]
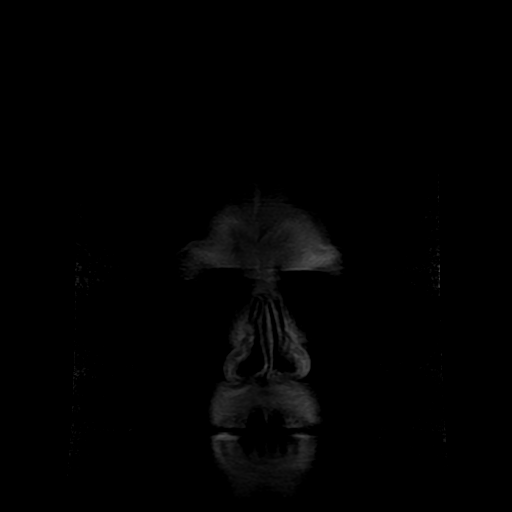
[im 29/29]
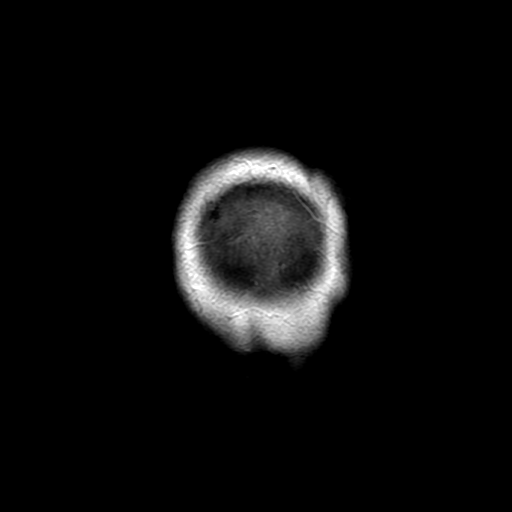

[Series 13: T1 post-contrast · sagittal · 5.0mm · 0.47mm/px · 2 of 24 slices shown (2 of 2)]
[im 1/24]
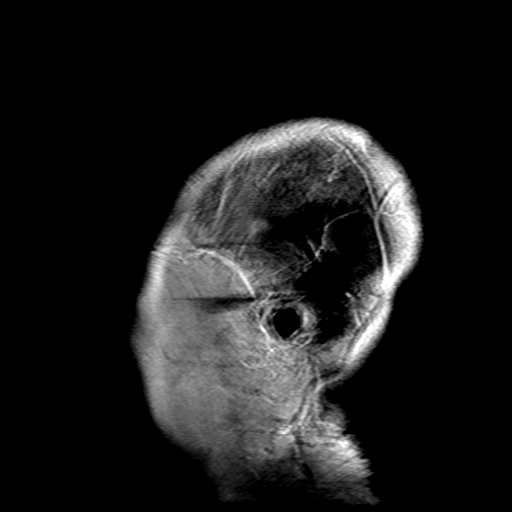
[im 24/24]
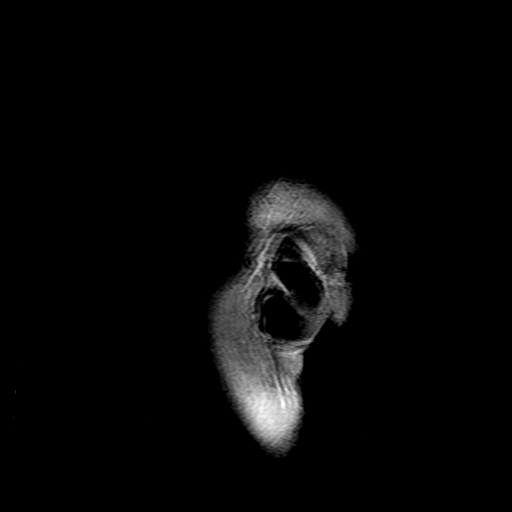

[Series 300: DWI · axial · 3.0mm · 1.09mm/px · z∈[-61,+99]mm · 5 of 56 slices shown (3 of 4)]
[im 1/56]
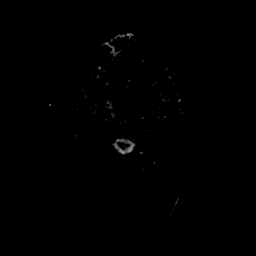
[im 14/56]
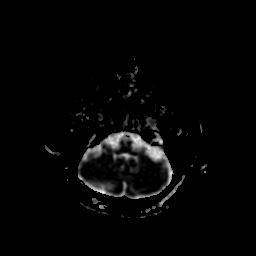
[im 28/56]
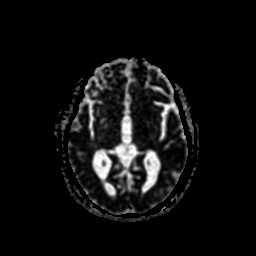
[im 42/56]
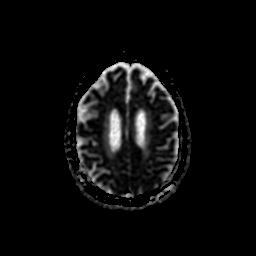
[im 56/56]
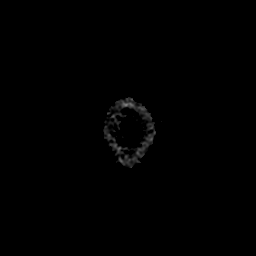

[Series 500: DWI · coronal · 5.0mm · 1.09mm/px · 3 of 36 slices shown (4 of 4)]
[im 1/36]
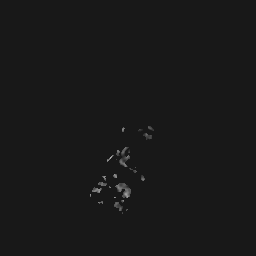
[im 18/36]
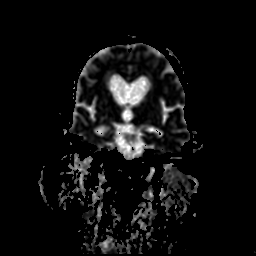
[im 36/36]
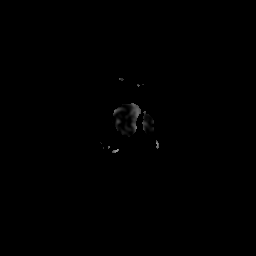

[34 of 48 positions shown; findings below may reference images not displayed]

FINDINGS: BRAIN: The midline structures are normal. There is no acute infarct
or acute hemorrhage. No mass lesion, hydrocephalus, dural
abnormality or extra-axial collection. Early confluent hyperintense
T2-weighted signal of the periventricular and deep white matter,
most commonly due to chronic ischemic microangiopathy. Generalized
atrophy without lobar predilection. No chronic microhemorrhage or
superficial siderosis.

VASCULAR: Major intracranial arterial and venous sinus flow voids
are preserved.

SKULL AND UPPER CERVICAL SPINE: The visualized skull base,
calvarium, upper cervical spine and extracranial soft tissues are
normal.

SINUSES/ORBITS: No fluid levels or advanced mucosal thickening. No
mastoid or middle ear effusion. Normal orbits.
IMPRESSION: 1. No acute intracranial abnormality.
2. Chronic small vessel disease and mild atrophy.
